# Patient Record
Sex: Male | Born: 1955 | Race: White | Hispanic: No | Marital: Single | Smoking: Current every day smoker
Health system: Southern US, Community
[De-identification: ages and names within clinical notes are randomized; demographics above are authoritative.]

## PROBLEM LIST (undated history)

## (undated) HISTORY — PX: COLON SURGERY: SHX602

## (undated) HISTORY — PX: APPENDECTOMY: SHX54

## (undated) HISTORY — PX: CYST REMOVAL TRUNK: SHX6283

---

## 2012-06-08 ENCOUNTER — Emergency Department: Payer: Self-pay | Admitting: Internal Medicine

## 2015-02-24 ENCOUNTER — Emergency Department: Payer: Self-pay

## 2015-02-24 ENCOUNTER — Encounter: Payer: Self-pay | Admitting: Emergency Medicine

## 2015-02-24 ENCOUNTER — Emergency Department
Admission: EM | Admit: 2015-02-24 | Discharge: 2015-02-24 | Disposition: A | Discharge status: Home / Self Care | Payer: Self-pay | Attending: Emergency Medicine | Admitting: Emergency Medicine

## 2015-02-24 DIAGNOSIS — Z720 Tobacco use: Secondary | ICD-10-CM | POA: Insufficient documentation

## 2015-02-24 DIAGNOSIS — N200 Calculus of kidney: Secondary | ICD-10-CM | POA: Insufficient documentation

## 2015-02-24 DIAGNOSIS — Z880 Allergy status to penicillin: Secondary | ICD-10-CM | POA: Insufficient documentation

## 2015-02-24 DIAGNOSIS — R109 Unspecified abdominal pain: Secondary | ICD-10-CM

## 2015-02-24 DIAGNOSIS — F419 Anxiety disorder, unspecified: Secondary | ICD-10-CM | POA: Insufficient documentation

## 2015-02-24 LAB — COMPREHENSIVE METABOLIC PANEL
ALK PHOS: 78 U/L (ref 38–126)
ALT: 15 U/L — AB (ref 17–63)
AST: 22 U/L (ref 15–41)
Albumin: 4.4 g/dL (ref 3.5–5.0)
Anion gap: 11 (ref 5–15)
BUN: 21 mg/dL — ABNORMAL HIGH (ref 6–20)
CALCIUM: 9.1 mg/dL (ref 8.9–10.3)
CO2: 26 mmol/L (ref 22–32)
CREATININE: 0.7 mg/dL (ref 0.61–1.24)
Chloride: 102 mmol/L (ref 101–111)
GFR calc non Af Amer: >60 mL/min (ref >60)
GLUCOSE: 93 mg/dL (ref 65–99)
Potassium: 3.6 mmol/L (ref 3.5–5.1)
SODIUM: 139 mmol/L (ref 135–145)
Total Bilirubin: 0.3 mg/dL (ref 0.3–1.2)
Total Protein: 7.6 g/dL (ref 6.5–8.1)

## 2015-02-24 LAB — URINALYSIS COMPLETE WITH MICROSCOPIC (ARMC ONLY)
BILIRUBIN URINE: NEGATIVE
Bacteria, UA: NONE SEEN
GLUCOSE, UA: NEGATIVE mg/dL
Hgb urine dipstick: NEGATIVE
Ketones, ur: NEGATIVE mg/dL
Nitrite: NEGATIVE
Protein, ur: NEGATIVE mg/dL
Specific Gravity, Urine: 1.023 (ref 1.005–1.030)
Squamous Epithelial / LPF: NONE SEEN
pH: 5 (ref 5.0–8.0)

## 2015-02-24 LAB — CBC
HEMATOCRIT: 40 % (ref 40.0–52.0)
HEMOGLOBIN: 13.3 g/dL (ref 13.0–18.0)
MCH: 32.9 pg (ref 26.0–34.0)
MCHC: 33.3 g/dL (ref 32.0–36.0)
MCV: 98.6 fL (ref 80.0–100.0)
Platelets: 238 10*3/uL (ref 150–440)
RBC: 4.06 MIL/uL — AB (ref 4.40–5.90)
RDW: 15 % — ABNORMAL HIGH (ref 11.5–14.5)
WBC: 9.1 10*3/uL (ref 3.8–10.6)

## 2015-02-24 LAB — LIPASE, BLOOD: Lipase: 32 U/L (ref 11–51)

## 2015-02-24 MED ORDER — HYDROCODONE-ACETAMINOPHEN 5-325 MG PO TABS: 1.0000 | ORAL_TABLET | ORAL | Status: AC | PRN

## 2015-02-24 MED ORDER — NAPROXEN 500 MG PO TABS: 500.0000 mg | ORAL_TABLET | Freq: Two times a day (BID) | ORAL | Status: AC

## 2015-02-24 MED ORDER — KETOROLAC TROMETHAMINE 30 MG/ML IJ SOLN
30.0000 mg | Freq: Once | INTRAMUSCULAR | Status: AC
  Administered 2015-02-24: 30 mg via INTRAVENOUS
  Filled 2015-02-24: qty 1

## 2015-02-24 MED ORDER — ONDANSETRON HCL 4 MG PO TABS: 4.0000 mg | ORAL_TABLET | Freq: Every day | ORAL | Status: AC | PRN

## 2015-02-24 MED ORDER — SODIUM CHLORIDE 0.9 % IV SOLN
1000.0000 mL | Freq: Once | INTRAVENOUS | Status: AC
  Administered 2015-02-24: 1000 mL via INTRAVENOUS

## 2015-02-24 MED ORDER — TAMSULOSIN HCL 0.4 MG PO CAPS: 0.4000 mg | ORAL_CAPSULE | Freq: Every day | ORAL | Status: AC

## 2015-02-24 NOTE — ED Notes (Signed)
Pt wanting to leave.  This nurse advised patient of AMA policy and benefits of staying.  Family and patient talked and decided to stay to finish fluid bolus and pain medicine at this time.

## 2015-02-24 NOTE — Discharge Instructions (Signed)
Kidney Stones °Kidney stones (urolithiasis) are deposits that form inside your kidneys. The intense pain is caused by the stone moving through the urinary tract. When the stone moves, the ureter goes into spasm around the stone. The stone is usually passed in the urine.  °CAUSES  °· A disorder that makes certain neck glands produce too much parathyroid hormone (primary hyperparathyroidism). °· A buildup of uric acid crystals, similar to gout in your joints. °· Narrowing (stricture) of the ureter. °· A kidney obstruction present at birth (congenital obstruction). °· Previous surgery on the kidney or ureters. °· Numerous kidney infections. °SYMPTOMS  °· Feeling sick to your stomach (nauseous). °· Throwing up (vomiting). °· Blood in the urine (hematuria). °· Pain that usually spreads (radiates) to the groin. °· Frequency or urgency of urination. °DIAGNOSIS  °· Taking a history and physical exam. °· Blood or urine tests. °· CT scan. °· Occasionally, an examination of the inside of the urinary bladder (cystoscopy) is performed. °TREATMENT  °· Observation. °· Increasing your fluid intake. °· Extracorporeal shock wave lithotripsy--This is a noninvasive procedure that uses shock waves to break up kidney stones. °· Surgery may be needed if you have severe pain or persistent obstruction. There are various surgical procedures. Most of the procedures are performed with the use of small instruments. Only small incisions are needed to accommodate these instruments, so recovery time is minimized. °The size, location, and chemical composition are all important variables that will determine the proper choice of action for you. Talk to your health care provider to better understand your situation so that you will minimize the risk of injury to yourself and your kidney.  °HOME CARE INSTRUCTIONS  °· Drink enough water and fluids to keep your urine clear or pale yellow. This will help you to pass the stone or stone fragments. °· Strain  all urine through the provided strainer. Keep all particulate matter and stones for your health care provider to see. The stone causing the pain may be as small as a grain of salt. It is very important to use the strainer each and every time you pass your urine. The collection of your stone will allow your health care provider to analyze it and verify that a stone has actually passed. The stone analysis will often identify what you can do to reduce the incidence of recurrences. °· Only take over-the-counter or prescription medicines for pain, discomfort, or fever as directed by your health care provider. °· Keep all follow-up visits as told by your health care provider. This is important. °· Get follow-up X-rays if required. The absence of pain does not always mean that the stone has passed. It may have only stopped moving. If the urine remains completely obstructed, it can cause loss of kidney function or even complete destruction of the kidney. It is your responsibility to make sure X-rays and follow-ups are completed. Ultrasounds of the kidney can show blockages and the status of the kidney. Ultrasounds are not associated with any radiation and can be performed easily in a matter of minutes. °· Make changes to your daily diet as told by your health care provider. You may be told to: °¨ Limit the amount of salt that you eat. °¨ Eat 5 or more servings of fruits and vegetables each day. °¨ Limit the amount of meat, poultry, fish, and eggs that you eat. °· Collect a 24-hour urine sample as told by your health care provider. You may need to collect another urine sample every 6-12   months. °SEEK MEDICAL CARE IF: °· You experience pain that is progressive and unresponsive to any pain medicine you have been prescribed. °SEEK IMMEDIATE MEDICAL CARE IF:  °· Pain cannot be controlled with the prescribed medicine. °· You have a fever or shaking chills. °· The severity or intensity of pain increases over 18 hours and is not  relieved by pain medicine. °· You develop a new onset of abdominal pain. °· You feel faint or pass out. °· You are unable to urinate. °  °This information is not intended to replace advice given to you by your health care provider. Make sure you discuss any questions you have with your health care provider. °  °Document Released: 04/16/2005 Document Revised: 01/05/2015 Document Reviewed: 09/17/2012 °[employer]vier Interactive Patient Education ©2016 [employer] ° °

## 2015-02-24 NOTE — ED Notes (Signed)
Dr, Cyril LoosenKinner in room to update pt of results. Pt verbalized understanding.

## 2015-02-24 NOTE — ED Notes (Signed)

## 2015-02-24 NOTE — ED Notes (Signed)
Patient transported to CT 

## 2015-02-24 NOTE — ED Provider Notes (Signed)
[facility] Emergency Department Provider Note  ____________________________________________  Time seen: On arrival  I have reviewed the triage vital signs and the nursing notes.   HISTORY  Chief Complaint Flank Pain    HPI Sydney Azure is a 59 y.o. male who presents with left flank pain. He reports it started abruptly approximately 2 hours ago after sitting down and became severe. It was cramping in nature and he developed nausea but did not vomit. He has never had this before. He denies injury to the area. He reports feeling relief after getting fentanyl by EMS. No fevers no chills. No dysuria. No history of urinary tract infections. He reports he feels fine now     History reviewed. No pertinent past medical history.  There are no active problems to display for this patient.   Past Surgical History  Procedure Laterality Date  . Cyst removal trunk    . Appendectomy    . Colon surgery      No current outpatient prescriptions on file.  Allergies Penicillins  History reviewed. No pertinent family history.  Social History Social History  Substance Use Topics  . Smoking status: Current Every Day Smoker -- 1.00 packs/day    Types: Cigarettes  . Smokeless tobacco: None  . Alcohol Use: Yes     Comment: 12 pack daily    Review of Systems  Constitutional: Negative for fever. Eyes: Negative for visual changes. ENT: Negative for sore throat Cardiovascular: Negative for chest pain. Respiratory: Negative for shortness of breath. Gastrointestinal: Positive for flank pain Genitourinary: Negative for dysuria. Musculoskeletal: Negative for back pain. Skin: Negative for rash. Neurological: Negative for headaches or focal weakness Psychiatric: Mild anxiety    ____________________________________________   PHYSICAL EXAM:  VITAL SIGNS: ED Triage Vitals  Enc Vitals Group     BP 02/24/15 1914 126/84 mmHg     Pulse Rate 02/24/15 1914 69      Resp 02/24/15 1914 18     Temp 02/24/15 1914 98.2 F (36.8 C)     Temp Source 02/24/15 1914 Oral     SpO2 02/24/15 1914 100 %     Weight 02/24/15 1914 135 lb (61.236 kg)     Height 02/24/15 1914 6' (1.829 m)     Head Cir --      Peak Flow --      Pain Score 02/24/15 1915 2     Pain Loc --      Pain Edu? --      Excl. in GC? --      Constitutional: Alert and oriented. Well appearing and in no distress. Eyes: Conjunctivae are normal.  ENT   Head: Normocephalic and atraumatic.   Mouth/Throat: Mucous membranes are moist. Cardiovascular: Normal rate, regular rhythm. Normal and symmetric distal pulses are present in all extremities. No murmurs, rubs, or gallops. Respiratory: Normal respiratory effort without tachypnea nor retractions. Breath sounds are clear and equal bilaterally.  Gastrointestinal: Soft and non-tender in all quadrants. No distention. There is no CVA tenderness. Genitourinary: deferred Musculoskeletal: Nontender with normal range of motion in all extremities. No lower extremity tenderness nor edema. Neurologic:  Normal speech and language. No gross focal neurologic deficits are appreciated. Skin:  Skin is warm, dry and intact. No rash noted. Psychiatric: Mood and affect are normal. Patient exhibits appropriate insight and judgment.  ____________________________________________    LABS (pertinent positives/negatives)  Labs Reviewed  URINALYSIS COMPLETEWITH MICROSCOPIC ([facility] ONLY) - Abnormal; Notable for the following:    Color, Urine  YELLOW (*)    APPearance HAZY (*)    Leukocytes, UA 3+ (*)    All other components within normal limits  CBC - Abnormal; Notable for the following:    RBC 4.06 (*)    RDW 15.0 (*)    All other components within normal limits  COMPREHENSIVE METABOLIC PANEL - Abnormal; Notable for the following:    BUN 21 (*)    ALT 15 (*)    All other components within normal limits  LIPASE, BLOOD     ____________________________________________   EKG  None  ____________________________________________    RADIOLOGY I have personally reviewed any xrays that were ordered on this patient: CT shows a 3 mm left UVJ stone  ____________________________________________   PROCEDURES  Procedure(s) performed: none  Critical Care performed: none  ____________________________________________   INITIAL IMPRESSION / ASSESSMENT AND PLAN / ED COURSE  Pertinent labs & imaging results that were available during my care of the patient were reviewed by me and considered in my medical decision making (see chart for details).  Patient's presentation most consistent with ureterolithiasis. We'll give fluids, check urine and labs and obtain a CT scan  Patient is apparently requesting to leave prior to full workup. The nurse was able to convince him to stay.  Urine shows 3+ leukocytes which are likely related to the kidney stone seen on CT scan. There are no bacteria and there are no nitrites so I do not believe there is any evidence for infection. In addition his white blood cell count is normal, his temperature is normal and his heart rate is normal as well.  IV Toradol was given for pain as well as a liter of fluid.  ----------------------------------------- 9:32 PM on 02/24/2015 -----------------------------------------  At this time patient is anxious to go home, he has no pain. I will discharge him with strict return precautions if any fevers chills or worsening pain  ____________________________________________   FINAL CLINICAL IMPRESSION(S) / ED DIAGNOSES  Final diagnoses:  Flank pain, acute  Kidney stone     Jene Everyobert Azarel Banner, MD 02/24/15 2132

## 2015-02-24 NOTE — ED Notes (Signed)
Pt to ED from home via EMS c/o left flank pain.  Pt states went to bathroom tonight and when sitting down patient felt sharp pain to left flank.  Pt states nausea, denies vomiting.  Pt given fentanyl en route via EMS.  Pt denies hx of kidney stones.  Pt is A&Ox4, speaking in complete and coherent sentences and in NAD at this time.

## 2016-03-21 IMAGING — CT CT RENAL STONE PROTOCOL
1 of 2 series · 15 of 32 positions shown, 19 images · non-contrast
Comparison: None.

CLINICAL DATA: Pt to ED from home via EMS c/o left flank pain. Pt
states went to bathroom tonight and when sitting down patient felt
sharp pain to left flank. Pt states nausea, denies vomiting.

EXAM:
CT ABDOMEN AND PELVIS WITHOUT CONTRAST
TECHNIQUE: Multidetector CT imaging of the abdomen and pelvis was performed
following the standard protocol without IV contrast.

[Series 2: stone standard full · axial · 0.76mm/px · z∈[-532,-142]mm · 15 of 86 slices shown, 19 images]
[im 4/86  soft-tissue]
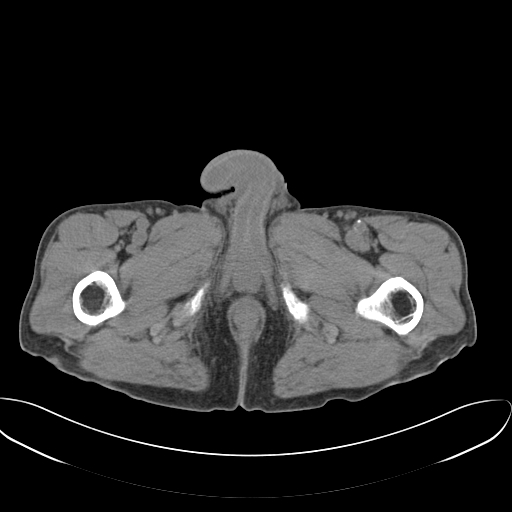
[im 4/86  bone]
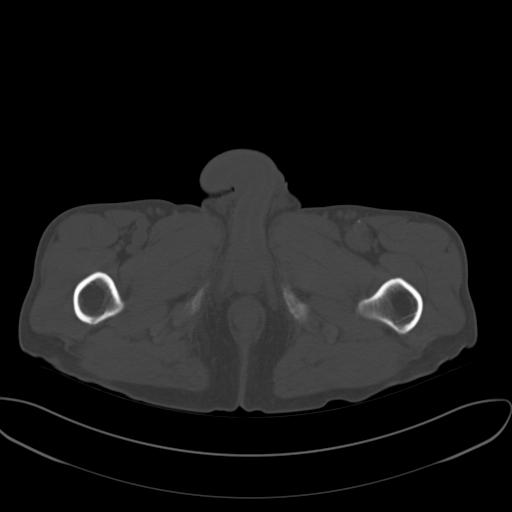
[im 11/86  soft-tissue]
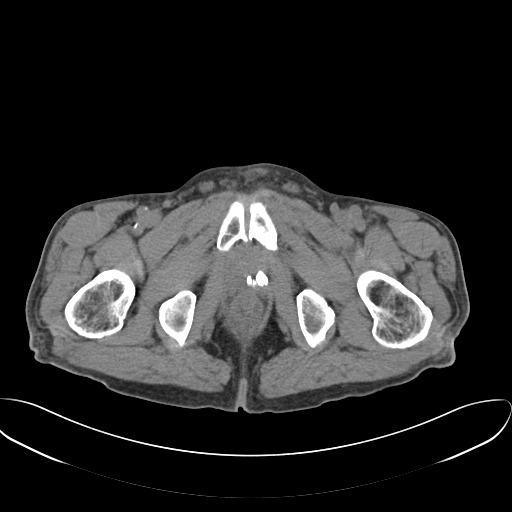
[im 18/86  soft-tissue]
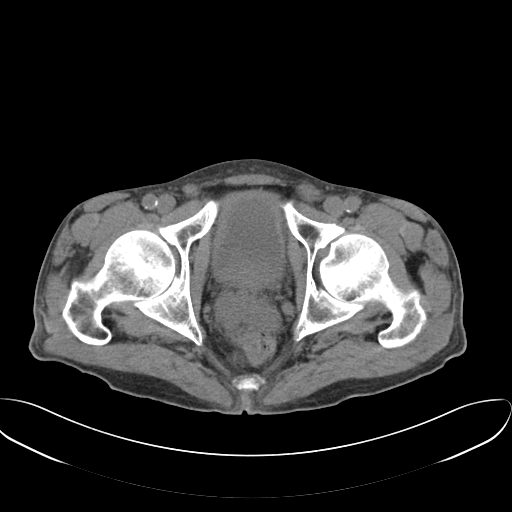
[im 24/86  soft-tissue]
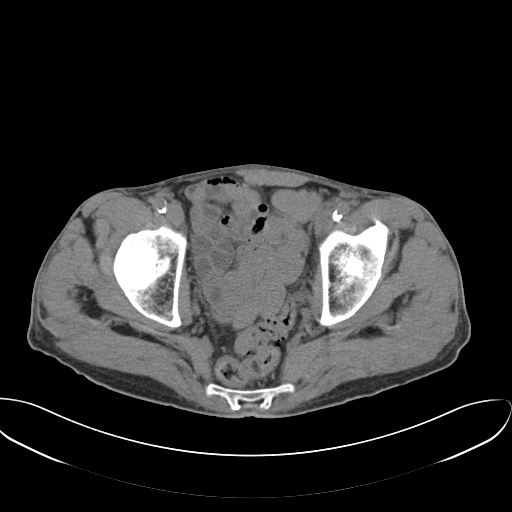
[im 31/86  soft-tissue]
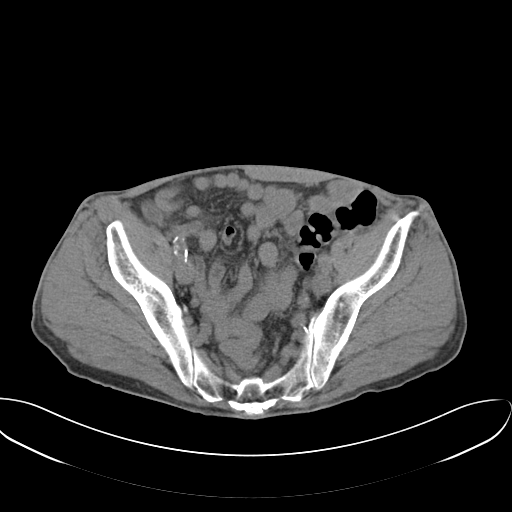
[im 38/86  soft-tissue]
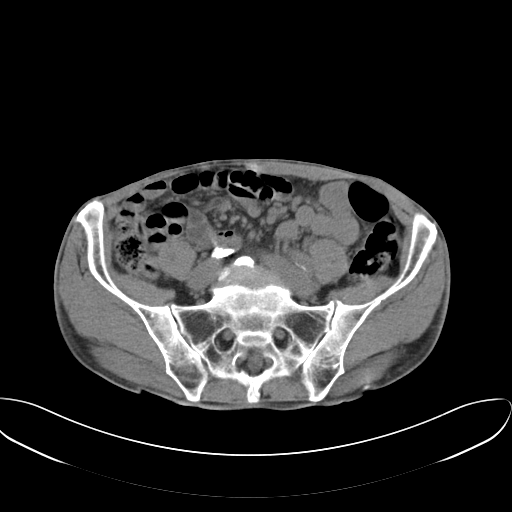
[im 45/86  soft-tissue]
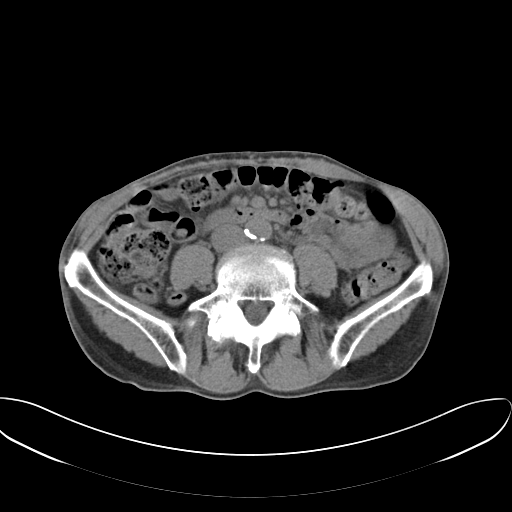
[im 48/86  soft-tissue]
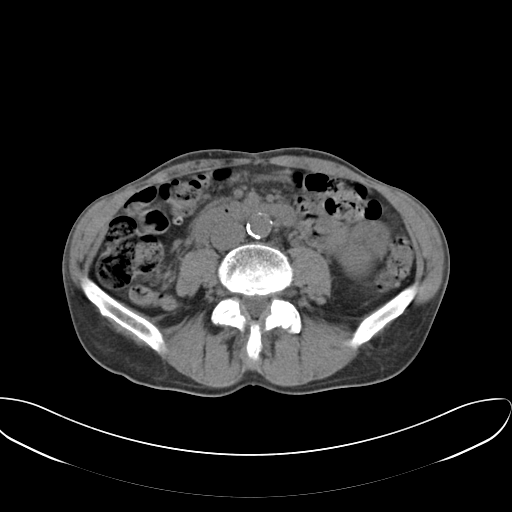
[im 55/86  soft-tissue]
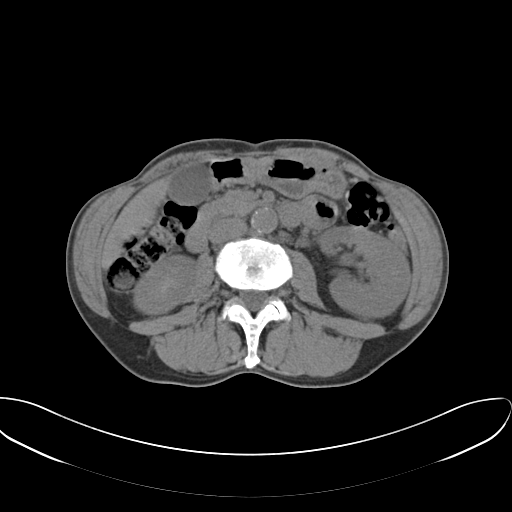
[im 55/86  bone]
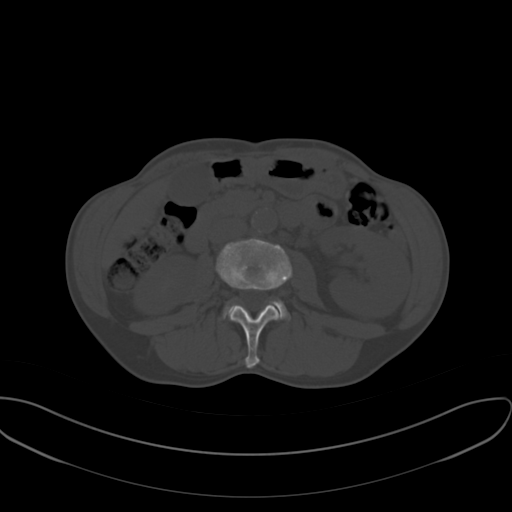
[im 62/86  soft-tissue]
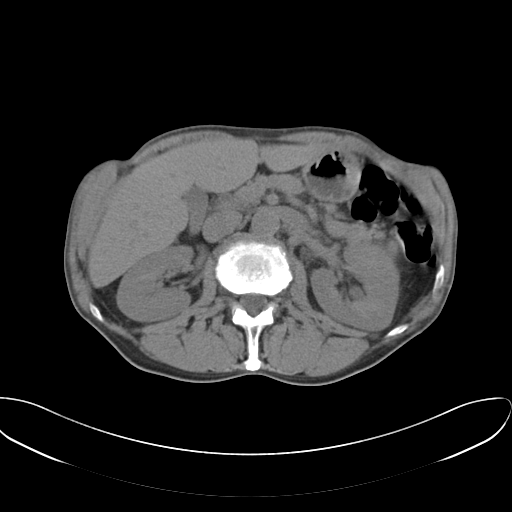
[im 69/86  soft-tissue]
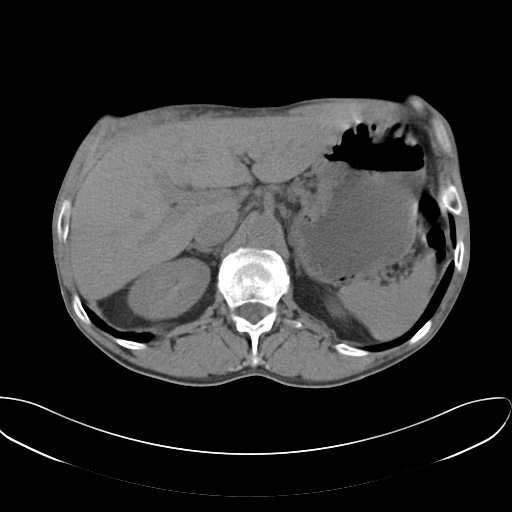
[im 72/86  lung]
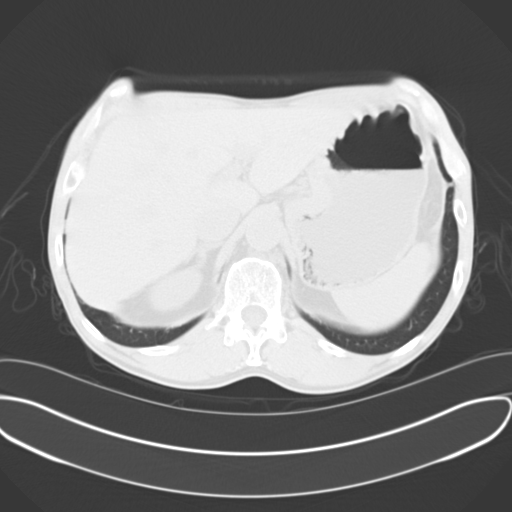
[im 75/86  soft-tissue]
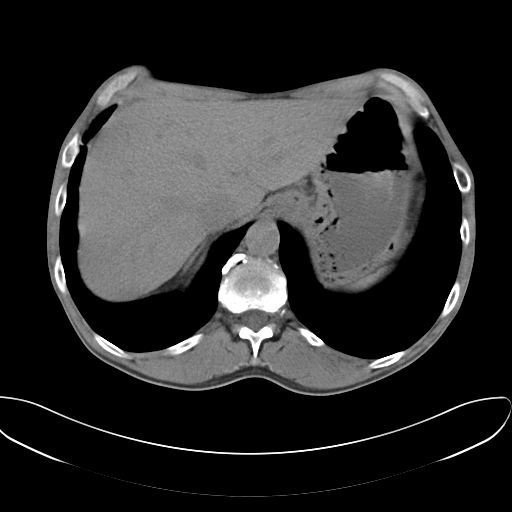
[im 75/86  lung]
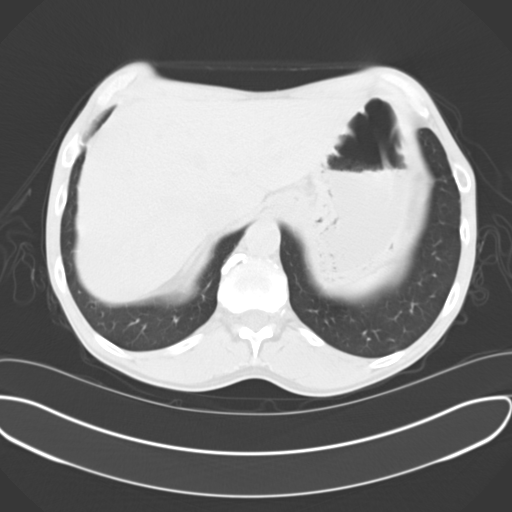
[im 79/86  lung]
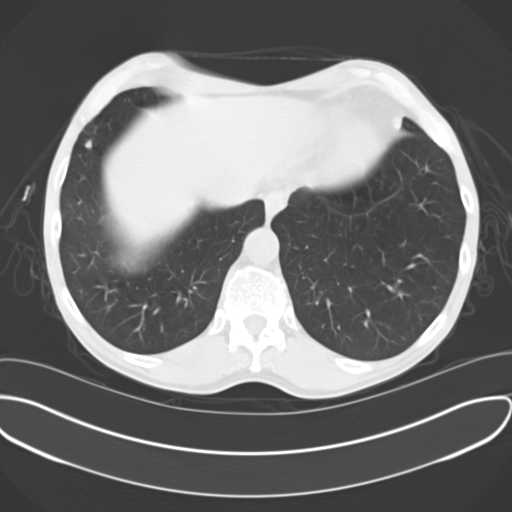
[im 82/86  soft-tissue]
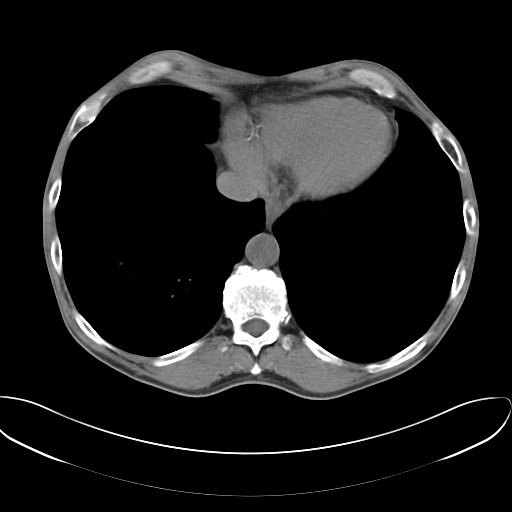
[im 82/86  lung]
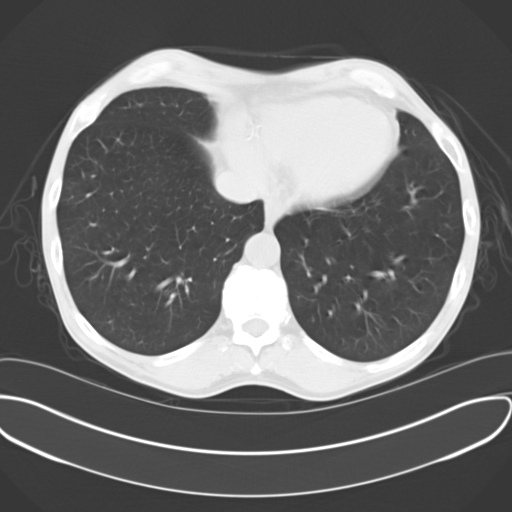

[15 of 32 positions shown; findings below may reference images not displayed]

FINDINGS: Lower chest: Right lower lobe calcified granuloma identified. Heart
size is normal. No imaged pericardial effusion or significant
coronary artery calcifications.

Upper abdomen: Left sided hydronephrosis noted. Left ureterovesical
junction calculus measures 3 mm and causes left-sided hydroureter. A
left renal cyst is 2.6 cm. Right kidney has a normal appearance.
Right ureter coarse is normal.

No focal abnormality identified within the liver, spleen, pancreas,
or adrenal glands. Gallbladder is present.

Gastrointestinal tract: The stomach and small bowel loops are normal
in appearance. There are scattered colonic diverticula but no acute
diverticulitis. The appendix is well seen and has a normal
appearance.

Pelvis: Densely calcified prostate gland. The urinary bladder is
collapsed. The seminal vesicles have a normal appearance.

Retroperitoneum: There is atherosclerotic calcification of the
abdominal aorta.No retroperitoneal or mesenteric adenopathy.

Abdominal wall: Unremarkable.

Osseous structures: Degenerative changes are seen in the lumbar
spine, notably at L5-S1 red there is vacuum disc phenomenon. No
suspicious lytic or blastic lesions are identified.
IMPRESSION: 1. Left hydronephrosis secondary to a left ureterovesical junction
calculus measuring 3 mm.
2. Left renal cysts.
3. Colonic diverticulosis.
4. Atherosclerotic calcification of the abdominal aorta.
5. Degenerative changes in the spine.

## 2020-03-30 DEATH — deceased
